# Patient Record
Sex: Male | Born: 1975 | Race: Black or African American | Hispanic: No | Marital: Single | Smoking: Never smoker
Health system: Southern US, Community
[De-identification: ages and names within clinical notes are randomized; demographics above are authoritative.]

---

## 2020-07-05 ENCOUNTER — Other Ambulatory Visit: Payer: Self-pay

## 2020-07-05 ENCOUNTER — Encounter (HOSPITAL_COMMUNITY): Payer: Self-pay | Admitting: Emergency Medicine

## 2020-07-05 ENCOUNTER — Emergency Department (HOSPITAL_COMMUNITY): Payer: BC Managed Care – PPO

## 2020-07-05 DIAGNOSIS — M542 Cervicalgia: Secondary | ICD-10-CM | POA: Diagnosis not present

## 2020-07-05 DIAGNOSIS — M79645 Pain in left finger(s): Secondary | ICD-10-CM | POA: Insufficient documentation

## 2020-07-05 DIAGNOSIS — R079 Chest pain, unspecified: Secondary | ICD-10-CM | POA: Insufficient documentation

## 2020-07-05 DIAGNOSIS — M25532 Pain in left wrist: Secondary | ICD-10-CM | POA: Insufficient documentation
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
## 2020-07-06 ENCOUNTER — Emergency Department (HOSPITAL_COMMUNITY): Payer: BC Managed Care – PPO

## 2020-07-06 ENCOUNTER — Emergency Department (HOSPITAL_COMMUNITY)
Admission: EM | Admit: 2020-07-06 | Discharge: 2020-07-06 | Discharge status: Against Medical Advice | Payer: BC Managed Care – PPO | Attending: Emergency Medicine | Admitting: Emergency Medicine

## 2020-07-06 DIAGNOSIS — R079 Chest pain, unspecified: Secondary | ICD-10-CM

## 2020-07-06 DIAGNOSIS — M542 Cervicalgia: Secondary | ICD-10-CM

## 2020-07-06 DIAGNOSIS — M79645 Pain in left finger(s): Secondary | ICD-10-CM

## 2020-07-06 DIAGNOSIS — M25532 Pain in left wrist: Secondary | ICD-10-CM

## 2020-07-06 MED ORDER — KETOROLAC TROMETHAMINE 30 MG/ML IJ SOLN
30.0000 mg | Freq: Once | INTRAMUSCULAR | Status: DC
  Filled 2020-07-06: qty 1

## 2020-07-06 MED ORDER — METHOCARBAMOL 500 MG PO TABS
1000.0000 mg | ORAL_TABLET | Freq: Once | ORAL | Status: DC
  Filled 2020-07-06: qty 2
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
## 2021-06-07 IMAGING — DX DG WRIST COMPLETE 3+V*L*
4 series · 4 of 4 positions shown · non-contrast
Comparison: None.

CLINICAL DATA: Pain

EXAM:
LEFT WRIST - COMPLETE 3+ VIEW

[wrist pa]
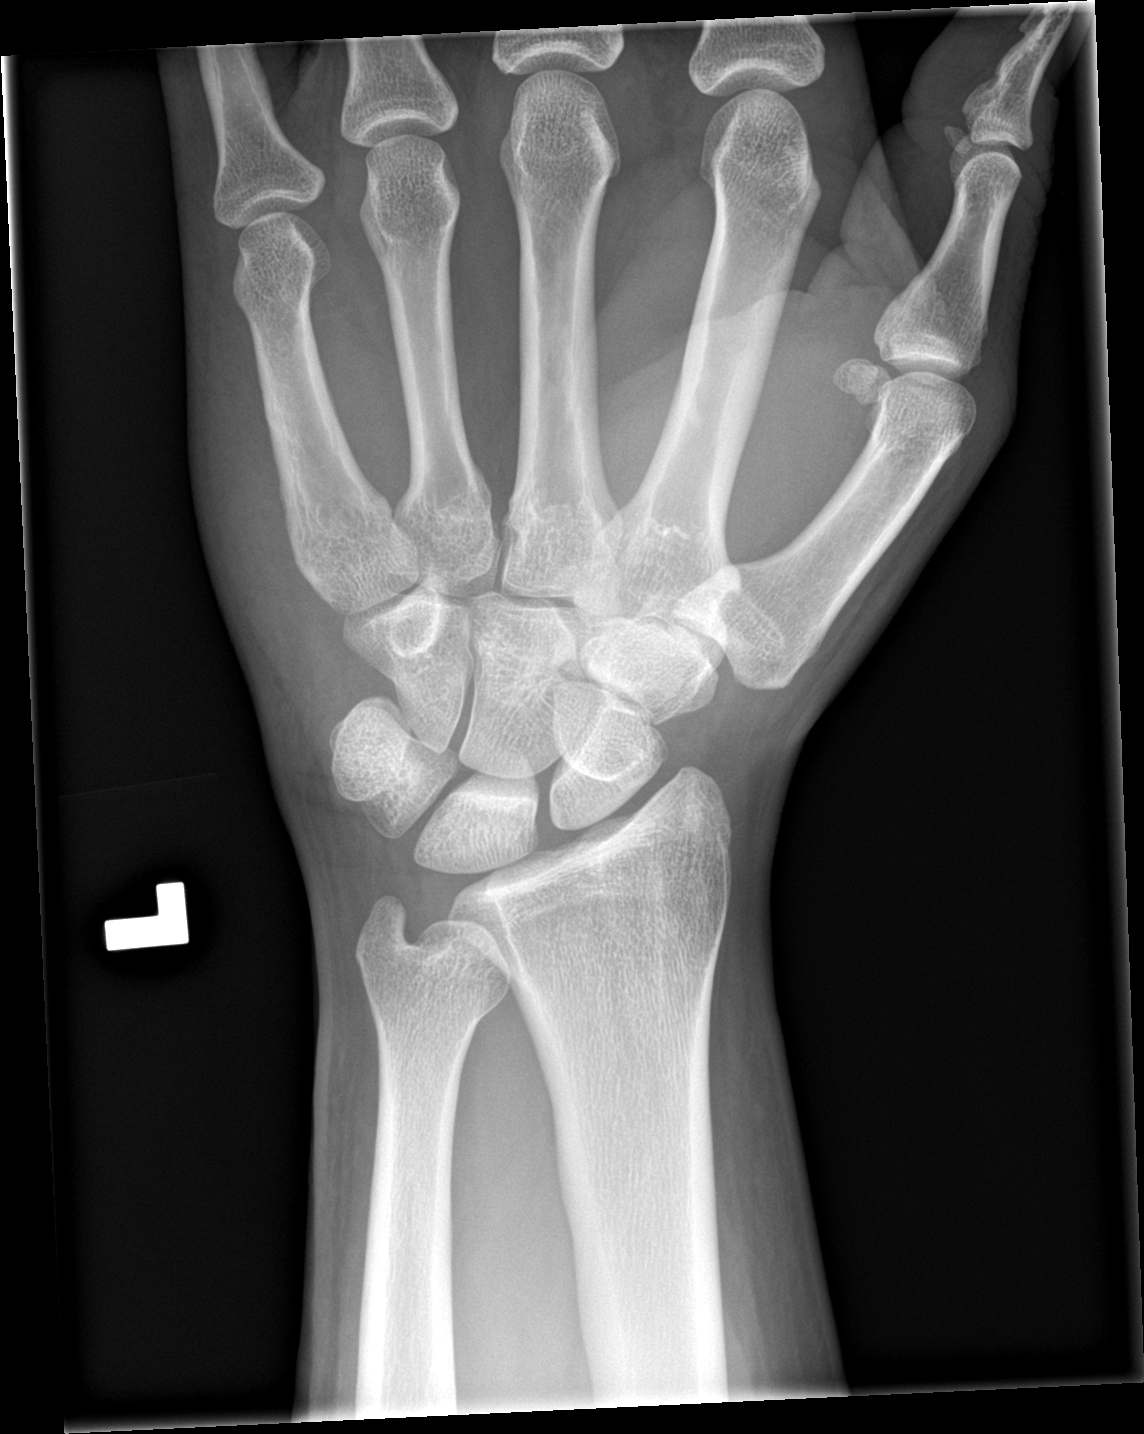

[wrist obl]
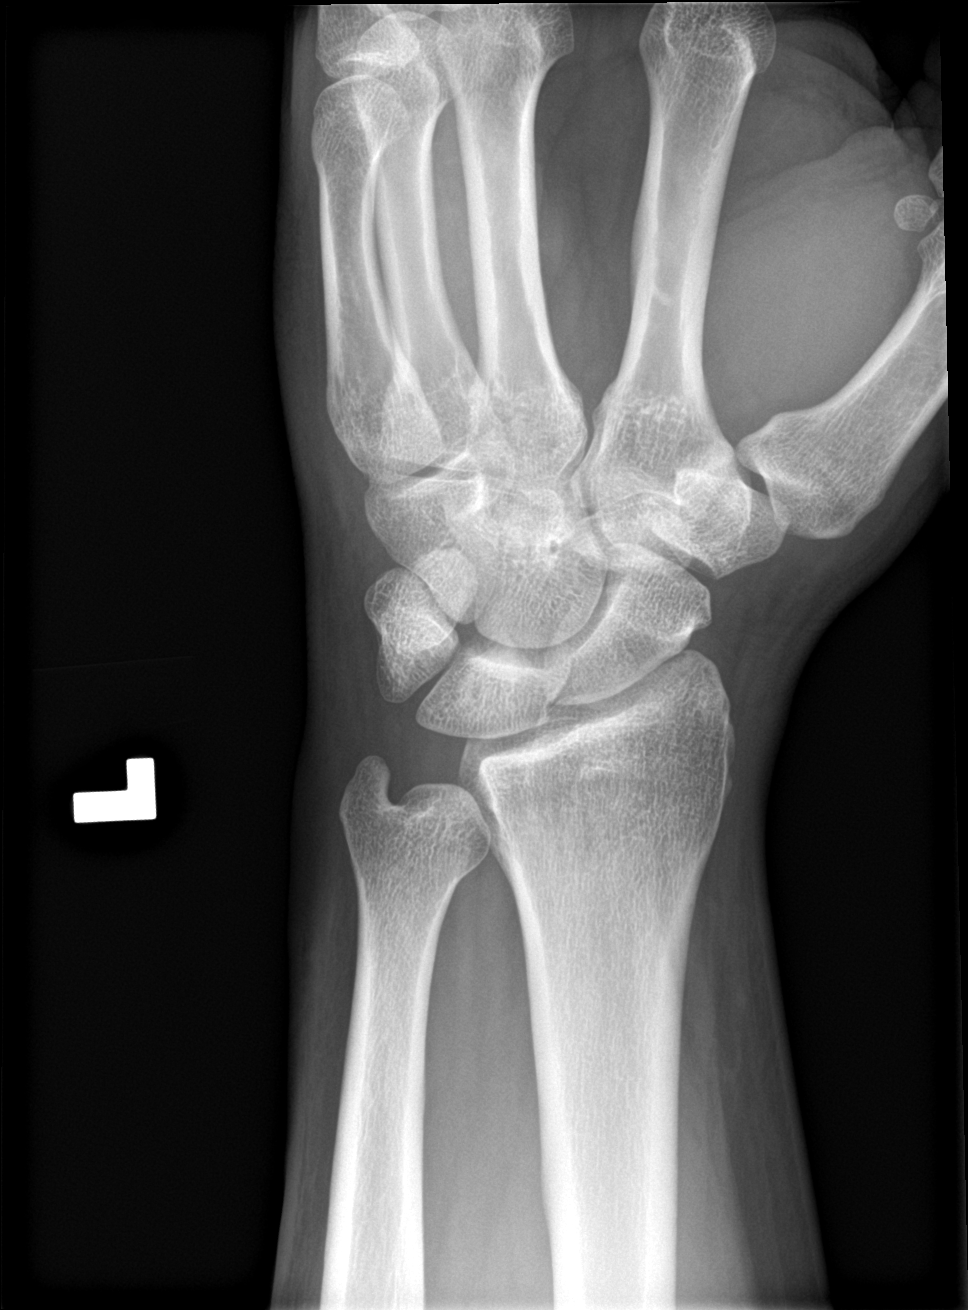

[wrist lat]
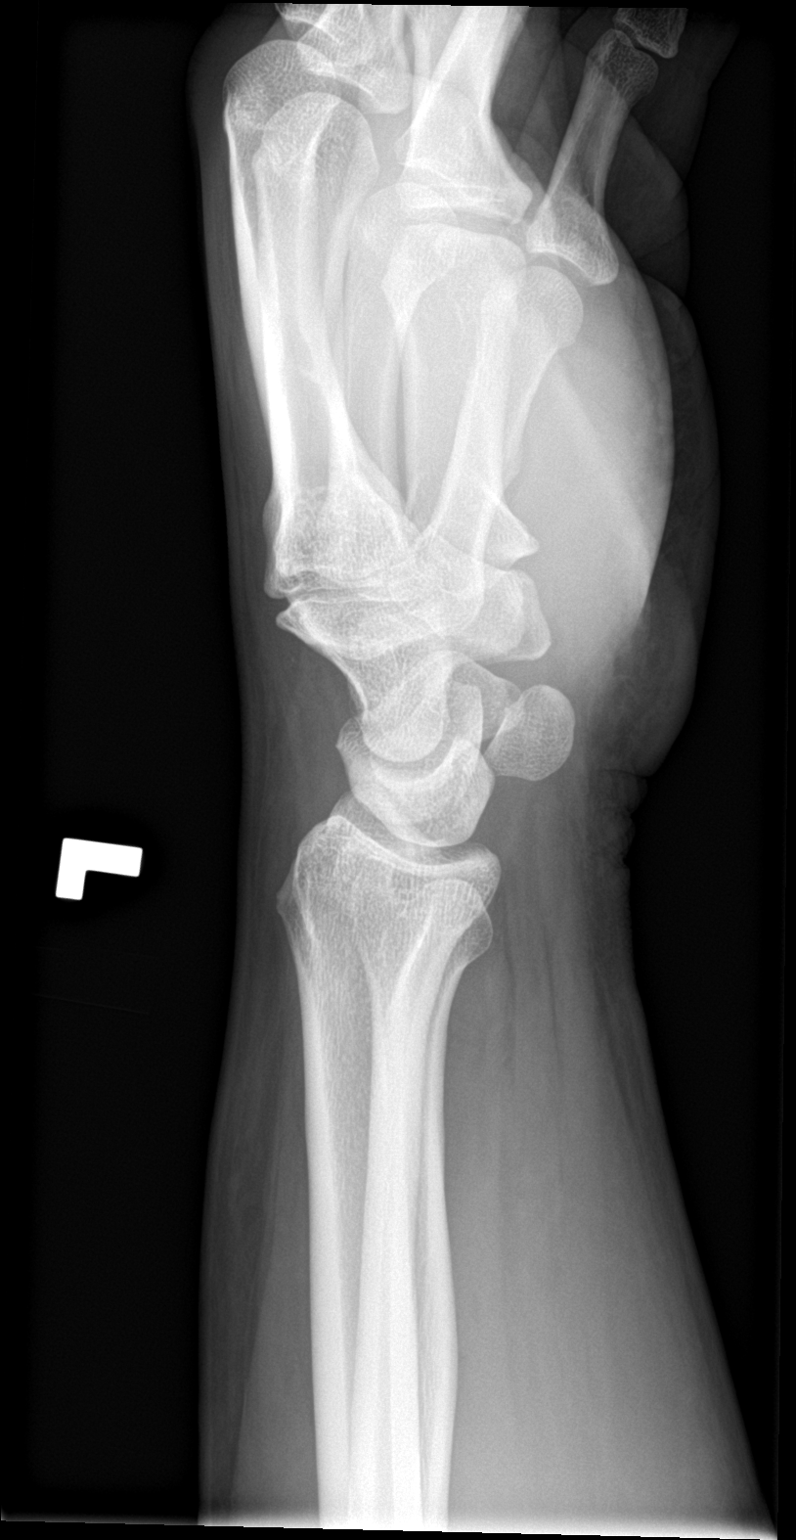

[wrist navicular]
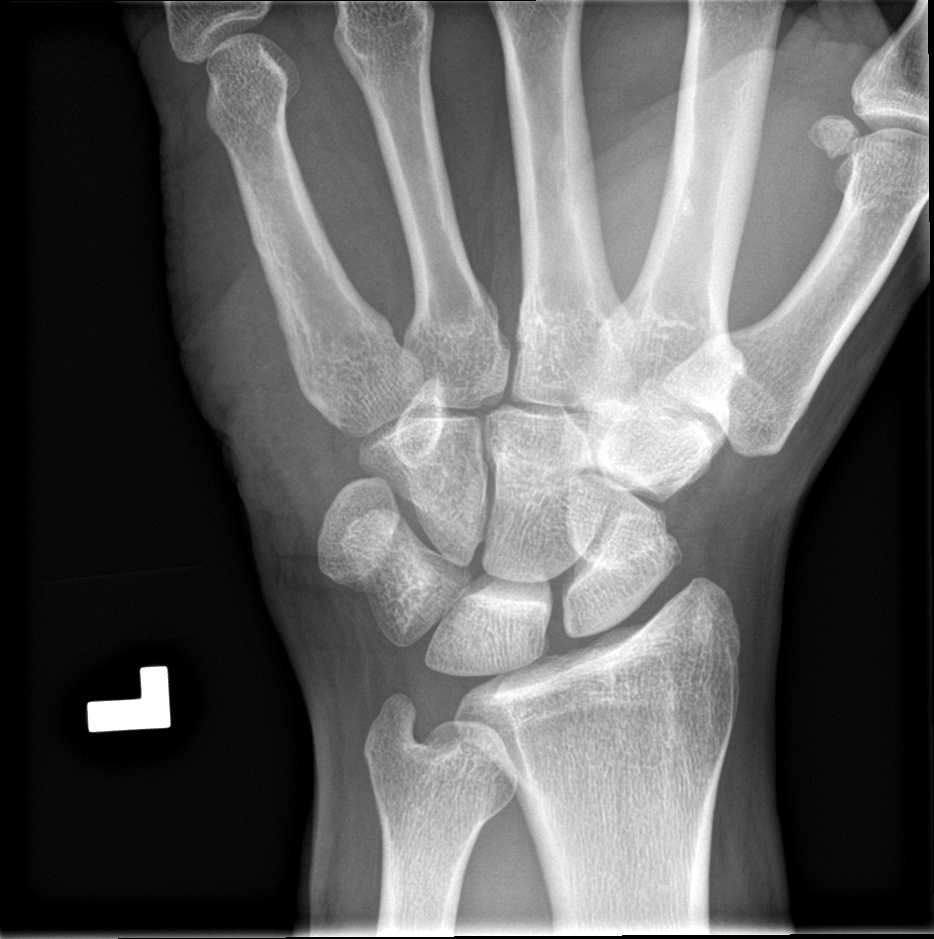

[4 of 4 positions shown; findings below may reference images not displayed]

FINDINGS: There is no evidence of fracture or dislocation. There is no
evidence of arthropathy or other focal bone abnormality. Soft
tissues are unremarkable.
IMPRESSION: Negative.
